# Patient Record
Sex: Male | Born: 1955
Health system: Southern US, Community
[De-identification: ages and names within clinical notes are randomized; demographics above are authoritative.]

## PROBLEM LIST (undated history)

## (undated) DIAGNOSIS — Z85528 Personal history of other malignant neoplasm of kidney: Secondary | ICD-10-CM

## (undated) DIAGNOSIS — M1990 Unspecified osteoarthritis, unspecified site: Secondary | ICD-10-CM

## (undated) DIAGNOSIS — I10 Essential (primary) hypertension: Secondary | ICD-10-CM

## (undated) HISTORY — PX: BACK SURGERY: SHX140

## (undated) HISTORY — PX: CHOLECYSTECTOMY: SHX55

## (undated) HISTORY — PX: NEPHRECTOMY: SHX65

---

## 2020-02-08 ENCOUNTER — Other Ambulatory Visit (HOSPITAL_BASED_OUTPATIENT_CLINIC_OR_DEPARTMENT_OTHER): Payer: Self-pay | Admitting: Physician Assistant

## 2020-02-08 ENCOUNTER — Emergency Department (HOSPITAL_BASED_OUTPATIENT_CLINIC_OR_DEPARTMENT_OTHER)
Admission: EM | Admit: 2020-02-08 | Discharge: 2020-02-08 | Disposition: A | Discharge status: Home / Self Care | Payer: Medicaid Other | Attending: Emergency Medicine | Admitting: Emergency Medicine

## 2020-02-08 ENCOUNTER — Encounter (HOSPITAL_BASED_OUTPATIENT_CLINIC_OR_DEPARTMENT_OTHER): Payer: Self-pay

## 2020-02-08 ENCOUNTER — Other Ambulatory Visit: Payer: Self-pay

## 2020-02-08 ENCOUNTER — Emergency Department (HOSPITAL_BASED_OUTPATIENT_CLINIC_OR_DEPARTMENT_OTHER): Payer: Medicaid Other

## 2020-02-08 DIAGNOSIS — I714 Abdominal aortic aneurysm, without rupture, unspecified: Secondary | ICD-10-CM

## 2020-02-08 DIAGNOSIS — Z85528 Personal history of other malignant neoplasm of kidney: Secondary | ICD-10-CM | POA: Insufficient documentation

## 2020-02-08 DIAGNOSIS — F17210 Nicotine dependence, cigarettes, uncomplicated: Secondary | ICD-10-CM | POA: Insufficient documentation

## 2020-02-08 DIAGNOSIS — K5732 Diverticulitis of large intestine without perforation or abscess without bleeding: Secondary | ICD-10-CM | POA: Diagnosis not present

## 2020-02-08 DIAGNOSIS — K5790 Diverticulosis of intestine, part unspecified, without perforation or abscess without bleeding: Secondary | ICD-10-CM

## 2020-02-08 DIAGNOSIS — R112 Nausea with vomiting, unspecified: Secondary | ICD-10-CM | POA: Diagnosis present

## 2020-02-08 DIAGNOSIS — Z79899 Other long term (current) drug therapy: Secondary | ICD-10-CM | POA: Diagnosis not present

## 2020-02-08 DIAGNOSIS — I700 Atherosclerosis of aorta: Secondary | ICD-10-CM

## 2020-02-08 DIAGNOSIS — Z7984 Long term (current) use of oral hypoglycemic drugs: Secondary | ICD-10-CM | POA: Insufficient documentation

## 2020-02-08 DIAGNOSIS — Z20822 Contact with and (suspected) exposure to covid-19: Secondary | ICD-10-CM | POA: Insufficient documentation

## 2020-02-08 DIAGNOSIS — I10 Essential (primary) hypertension: Secondary | ICD-10-CM | POA: Insufficient documentation

## 2020-02-08 DIAGNOSIS — R197 Diarrhea, unspecified: Secondary | ICD-10-CM

## 2020-02-08 HISTORY — DX: Essential (primary) hypertension: I10

## 2020-02-08 HISTORY — DX: Unspecified osteoarthritis, unspecified site: M19.90

## 2020-02-08 HISTORY — DX: Personal history of other malignant neoplasm of kidney: Z85.528

## 2020-02-08 LAB — COMPREHENSIVE METABOLIC PANEL
ALT: 14 U/L (ref 0–44)
AST: 18 U/L (ref 15–41)
Albumin: 4 g/dL (ref 3.5–5.0)
Alkaline Phosphatase: 59 U/L (ref 38–126)
Anion gap: 7 (ref 5–15)
BUN: 14 mg/dL (ref 8–23)
CO2: 25 mmol/L (ref 22–32)
Calcium: 8.8 mg/dL — ABNORMAL LOW (ref 8.9–10.3)
Chloride: 105 mmol/L (ref 98–111)
Creatinine, Ser: 1.15 mg/dL (ref 0.61–1.24)
GFR, Estimated: >60 mL/min (ref >60)
Glucose, Bld: 107 mg/dL — ABNORMAL HIGH (ref 70–99)
Potassium: 3.8 mmol/L (ref 3.5–5.1)
Sodium: 137 mmol/L (ref 135–145)
Total Bilirubin: 0.3 mg/dL (ref 0.3–1.2)
Total Protein: 7.3 g/dL (ref 6.5–8.1)

## 2020-02-08 LAB — CBC WITH DIFFERENTIAL/PLATELET
Abs Immature Granulocytes: 0.02 10*3/uL (ref 0.00–0.07)
Basophils Absolute: 0 10*3/uL (ref 0.0–0.1)
Basophils Relative: 1 %
Eosinophils Absolute: 0.2 10*3/uL (ref 0.0–0.5)
Eosinophils Relative: 5 %
HCT: 41.7 % (ref 39.0–52.0)
Hemoglobin: 13.5 g/dL (ref 13.0–17.0)
Immature Granulocytes: 0 %
Lymphocytes Relative: 34 %
Lymphs Abs: 1.7 10*3/uL (ref 0.7–4.0)
MCH: 27.3 pg (ref 26.0–34.0)
MCHC: 32.4 g/dL (ref 30.0–36.0)
MCV: 84.4 fL (ref 80.0–100.0)
Monocytes Absolute: 0.5 10*3/uL (ref 0.1–1.0)
Monocytes Relative: 9 %
Neutro Abs: 2.6 10*3/uL (ref 1.7–7.7)
Neutrophils Relative %: 51 %
Platelets: 210 10*3/uL (ref 150–400)
RBC: 4.94 MIL/uL (ref 4.22–5.81)
RDW: 13.7 % (ref 11.5–15.5)
WBC: 5 10*3/uL (ref 4.0–10.5)
nRBC: 0 % (ref 0.0–0.2)

## 2020-02-08 LAB — URINALYSIS, ROUTINE W REFLEX MICROSCOPIC
Bilirubin Urine: NEGATIVE
Glucose, UA: NEGATIVE mg/dL
Hgb urine dipstick: NEGATIVE
Ketones, ur: NEGATIVE mg/dL
Leukocytes,Ua: NEGATIVE
Nitrite: NEGATIVE
Protein, ur: NEGATIVE mg/dL
Specific Gravity, Urine: 1.02 (ref 1.005–1.030)
pH: 6.5 (ref 5.0–8.0)

## 2020-02-08 LAB — RESP PANEL BY RT PCR (RSV, FLU A&B, COVID)
Influenza A by PCR: NEGATIVE
Influenza B by PCR: NEGATIVE
Respiratory Syncytial Virus by PCR: NEGATIVE
SARS Coronavirus 2 by RT PCR: NEGATIVE

## 2020-02-08 LAB — LIPASE, BLOOD: Lipase: 59 U/L — ABNORMAL HIGH (ref 11–51)

## 2020-02-08 MED ORDER — ONDANSETRON HCL 4 MG/2ML IJ SOLN
4.0000 mg | Freq: Once | INTRAMUSCULAR | Status: AC
  Administered 2020-02-08: 4 mg via INTRAVENOUS
  Filled 2020-02-08: qty 2

## 2020-02-08 MED ORDER — HYDROMORPHONE HCL 1 MG/ML IJ SOLN
1.0000 mg | Freq: Once | INTRAMUSCULAR | Status: AC
  Administered 2020-02-08: 1 mg via INTRAVENOUS
  Filled 2020-02-08: qty 1

## 2020-02-08 MED ORDER — MORPHINE SULFATE (PF) 4 MG/ML IV SOLN
4.0000 mg | Freq: Once | INTRAVENOUS | Status: AC
  Administered 2020-02-08: 4 mg via INTRAVENOUS
  Filled 2020-02-08: qty 1

## 2020-02-08 MED ORDER — ONDANSETRON HCL 4 MG PO TABS: 4.0000 mg | ORAL_TABLET | Freq: Four times a day (QID) | ORAL | 0 refills | Status: DC

## 2020-02-08 MED ORDER — SODIUM CHLORIDE 0.9 % IV BOLUS
1000.0000 mL | Freq: Once | INTRAVENOUS | Status: AC
  Administered 2020-02-08: 1000 mL via INTRAVENOUS

## 2020-02-08 NOTE — ED Provider Notes (Signed)
[facility] EMERGENCY DEPARTMENT Provider Note   CSN: [id number] Arrival date & time: 02/08/20  1744     History Chief Complaint  Patient presents with  . Abdominal Pain    Chase Floyd is a 64 y.o. male.  HPI   Pt is a 64 y/o male with a h/o arthritis, renal carcinoma s/p R nephrectomy, cholecystectomy, who presents to the ED today for eval of NVD for the last 3 days. Reports 3-4 episodes of vomiting daily and about 3-4 episodes of diarrhea.  Reports diffuse abd pain that was intermittent initially but is now constant. Rates pain 10/10. Denies fevers. Reports urinary frequency, but denies dysuria or urgency.  Past Medical History:  Diagnosis Date  . Arthritis   . History of kidney cancer   . Hypertension     There are no problems to display for this patient.   Past Surgical History:  Procedure Laterality Date  . BACK SURGERY    . CHOLECYSTECTOMY    . NEPHRECTOMY         No family history on file.  Social History   Tobacco Use  . Smoking status: Current Every Day Smoker    Types: Cigarettes  . Smokeless tobacco: Never Used  Vaping Use  . Vaping Use: Never used  Substance Use Topics  . Alcohol use: Yes    Comment: occ  . Drug use: Yes    Types: Marijuana    Home Medications Prior to Admission medications   Medication Sig Start Date End Date Taking? Authorizing Provider  amLODipine (NORVASC) 10 MG tablet Take 10 mg by mouth daily. 12/29/19   [provider]  cyclobenzaprine (FLEXERIL) 10 MG tablet Take 10 mg by mouth 2 (two) times daily as needed. 12/29/19   [provider]  metFORMIN (GLUCOPHAGE) 500 MG tablet Take 500 mg by mouth 2 (two) times daily. 10/05/19   [provider]  ondansetron (ZOFRAN) 4 MG tablet Take 1 tablet (4 mg total) by mouth every 6 (six) hours. 02/08/20   Takeria Marquina S, PA-C  pantoprazole (PROTONIX) 40 MG tablet Take 40 mg by mouth 2 (two) times daily. 12/29/19   [provider]  traMADol  (ULTRAM) 50 MG tablet Take 50 mg by mouth 3 (three) times daily as needed. 12/29/19   [provider]    Allergies    Patient has no known allergies.  Review of Systems   Review of Systems  Constitutional: Negative for fever.  HENT: Negative for ear pain and sore throat.   Eyes: Negative for visual disturbance.  Respiratory: Negative for cough and shortness of breath.   Cardiovascular: Negative for chest pain.  Gastrointestinal: Positive for abdominal pain, diarrhea, nausea and vomiting.  Genitourinary: Negative for dysuria and hematuria.  Musculoskeletal: Negative for back pain.  Skin: Negative for rash.  Neurological: Negative for seizures and syncope.  All other systems reviewed and are negative.   Physical Exam Updated Vital Signs BP 135/86 (BP Location: Right Arm)   Pulse 75   Temp 98.5 F (36.9 C) (Oral)   Resp 19   Ht 6\' 2"  (1.88 m)   Wt 106.6 kg   SpO2 99%   BMI 30.17 kg/m   Physical Exam Vitals and nursing note reviewed.  Constitutional:      Appearance: He is well-developed.  HENT:     Head: Normocephalic and atraumatic.  Eyes:     Conjunctiva/sclera: Conjunctivae normal.  Cardiovascular:     Rate and Rhythm: Normal rate and regular  rhythm.     Heart sounds: Normal heart sounds. No murmur heard.   Pulmonary:     Effort: Pulmonary effort is normal. No respiratory distress.     Breath sounds: Normal breath sounds. No wheezing, rhonchi or rales.  Abdominal:     General: Bowel sounds are normal.     Palpations: Abdomen is soft.     Tenderness: There is abdominal tenderness in the right lower quadrant. There is guarding. There is no rebound.  Musculoskeletal:     Cervical back: Neck supple.  Skin:    General: Skin is warm and dry.  Neurological:     Mental Status: He is alert.     ED Results / Procedures / Treatments   Labs (all labs ordered are listed, but only abnormal results are displayed) Labs Reviewed  COMPREHENSIVE METABOLIC PANEL -  Abnormal; Notable for the following components:      Result Value   Glucose, Bld 107 (*)    Calcium 8.8 (*)    All other components within normal limits  LIPASE, BLOOD - Abnormal; Notable for the following components:   Lipase 59 (*)    All other components within normal limits  RESP PANEL BY RT PCR (RSV, FLU A&B, COVID)  CBC WITH DIFFERENTIAL/PLATELET  URINALYSIS, ROUTINE W REFLEX MICROSCOPIC    EKG None  Radiology CT ABDOMEN PELVIS WO CONTRAST  Result Date: 02/08/2020 CLINICAL DATA:  64 year old male with right lower quadrant abdominal pain. EXAM: CT ABDOMEN AND PELVIS WITHOUT CONTRAST TECHNIQUE: Multidetector CT imaging of the abdomen and pelvis was performed following the standard protocol without IV contrast. COMPARISON:  CT abdomen pelvis dated 07/15/2017. FINDINGS: Evaluation of this exam is limited in the absence of intravenous contrast. Lower chest: Small scattered pneumatoceles. The visualized lung bases are otherwise clear. Coronary vascular calcifications noted. No intra-abdominal free air or free fluid. Hepatobiliary: The liver is unremarkable. No intrahepatic biliary ductal dilatation. Cholecystectomy. No retained calcified stone noted the central CBD. Pancreas: Unremarkable. No pancreatic ductal dilatation or surrounding inflammatory changes. Spleen: Normal in size without focal abnormality. Adrenals/Urinary Tract: The adrenal glands unremarkable. Status post prior right nephrectomy. No abnormal soft tissue noted in the nephrectomy bed. The left kidney is unremarkable. The left ureter and urinary bladder appear unremarkable. Stomach/Bowel: There is sigmoid diverticulosis without active inflammatory changes. There is no bowel obstruction. The appendix is normal. Vascular/Lymphatic: Moderate aortoiliac atherosclerotic disease. A 3 cm infrarenal abdominal aortic aneurysm similar to prior CT of 2019. Follow-up as per recommendation of prior CT. The IVC is unremarkable. No portal venous  gas. There is no adenopathy. Reproductive: The prostate and seminal vesicles are grossly unremarkable. No pelvic mass. Other: None Musculoskeletal: Degenerative changes of the spine. No acute osseous pathology. IMPRESSION: 1. No acute intra-abdominal or pelvic pathology. No bowel obstruction. Normal appendix. 2. Sigmoid diverticulosis. 3. Aortic Atherosclerosis (ICD10-I70.0). 4. A 3 cm infrarenal abdominal aortic aneurysm. Follow-up as per recommendation of prior CT. Electronically Signed   By: Elgie Collard M.D.   On: 02/08/2020 18:54    Procedures Procedures (including critical care time)  Medications Ordered in ED Medications  sodium chloride 0.9 % bolus 1,000 mL ( Intravenous Stopped 02/08/20 1956)  ondansetron (ZOFRAN) injection 4 mg (4 mg Intravenous Given 02/08/20 1851)  morphine 4 MG/ML injection 4 mg (4 mg Intravenous Given 02/08/20 1854)  HYDROmorphone (DILAUDID) injection 1 mg (1 mg Intravenous Given 02/08/20 2009)    ED Course  I have reviewed the triage vital signs and the nursing notes.  Pertinent labs & imaging results that were available during my care of the patient were reviewed by me and considered in my medical decision making (see chart for details).    MDM Rules/Calculators/A&P                          64 year old male presenting for evaluation of abdominal pain nausea vomiting diarrhea for the last 4 days.  Afebrile, vital signs reassuring.  He does have right lower quadrant tenderness on exam with guarding.  Reviewed/interpreted lab CBC is without leukocytosis or anemia CMP with normal electrolytes, kidney and liver function Lipase is marginally elevated  - could be early pancreatitis however this seems more related to viral illness with diarrhea UA negative for UTI  CT abdomen/pelvis - 1. No acute intra-abdominal or pelvic pathology. No bowel obstruction. Normal appendix. 2. Sigmoid diverticulosis. 3. Aortic Atherosclerosis (ICD10-I70.0). 4. A 3 cm  infrarenal abdominal aortic aneurysm. Follow-up as per recommendation of prior CT.   Patient was given IV fluids, pain medications and Zofran.  On reassessment patient's pain is feeling improved.  We discussed the CT findings including chronic changes and no acute findings.  Discussed likely diagnosis of viral illness though could be early pancreatitis given marginal elevation of his lipase.  Will give Zofran for home.  Have advised close follow-up with PCP and strict return precautions.  He voices understanding of the plan and reasons to return.  All questions answered.  Patient stable for discharge.    Final Clinical Impression(s) / ED Diagnoses Final diagnoses:  Nausea vomiting and diarrhea  Diverticulosis  Abdominal aortic aneurysm (AAA) without rupture (HCC)  Aortic atherosclerosis (HCC)    Rx / DC Orders ED Discharge Orders         Ordered    ondansetron (ZOFRAN) 4 MG tablet  Every 6 hours,   Status:  Discontinued        02/08/20 2002    ondansetron (ZOFRAN) 4 MG tablet  Every 6 hours        02/08/20 2003           Rayne Du 02/08/20 2105    Gwyneth Sprout, MD 02/11/20 [phone]

## 2020-02-08 NOTE — ED Triage Notes (Signed)
Pt c/o abd pain, n/v/d day 3-NAD-steady slow gait

## 2020-02-08 NOTE — ED Notes (Signed)
Pt given coke 

## 2020-02-08 NOTE — Discharge Instructions (Signed)
You were given a prescription for zofran to help with your nausea. Please take as directed.  Stay well hydrated.   Follow up with the vascular doctor in regards to your abdominal aortic aneurysm.  Please follow up with your primary doctor within the next 5-7 days.  If you do not have a primary care provider, information for a healthcare clinic has been provided for you to make arrangements for follow up care. Please return to the ER sooner if you have any new or worsening symptoms, or if you have any of the following symptoms:  Abdominal pain that does not go away.  You have a fever.  You keep throwing up (vomiting).  The pain is felt only in portions of the abdomen. Pain in the right side could possibly be appendicitis. In an adult, pain in the left lower portion of the abdomen could be colitis or diverticulitis.  You pass bloody or black tarry stools.  There is bright red blood in the stool.  The constipation stays for more than 4 days.  There is belly (abdominal) or rectal pain.  You do not seem to be getting better.  You have any questions or concerns.

## 2020-02-09 MED FILL — ONDANSETRON HCL 4 MG TABLET: 4 | 3 days supply | Qty: 12 | Fill #0

## 2021-03-30 IMAGING — CT CT ABD-PELV W/O CM
2 of 4 series · 16 of 46 positions shown, 18 images · non-contrast
Comparison: CT abdomen pelvis dated 07/15/2017.

CLINICAL DATA: 64-year-old male with right lower quadrant abdominal
pain.

EXAM:
CT ABDOMEN AND PELVIS WITHOUT CONTRAST
TECHNIQUE: Multidetector CT imaging of the abdomen and pelvis was performed
following the standard protocol without IV contrast.

[Series 2: axial st · axial · 0.79mm/px · z∈[-539,-94]mm · 13 of 99 slices shown, 15 images]
[im 5/99  soft-tissue]
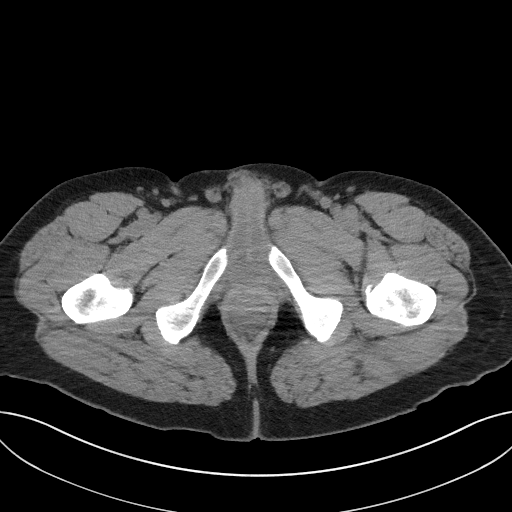
[im 5/99  bone]
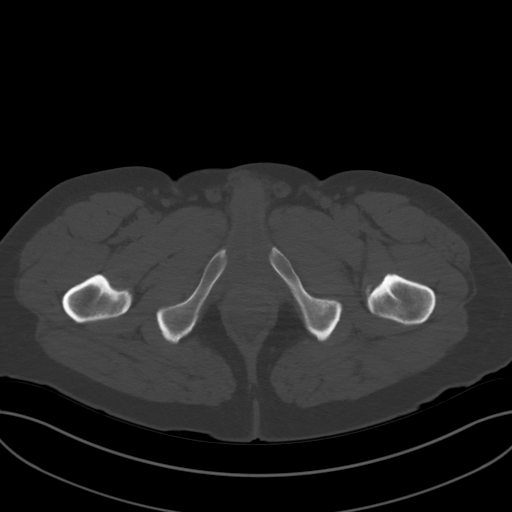
[im 13/99  soft-tissue]
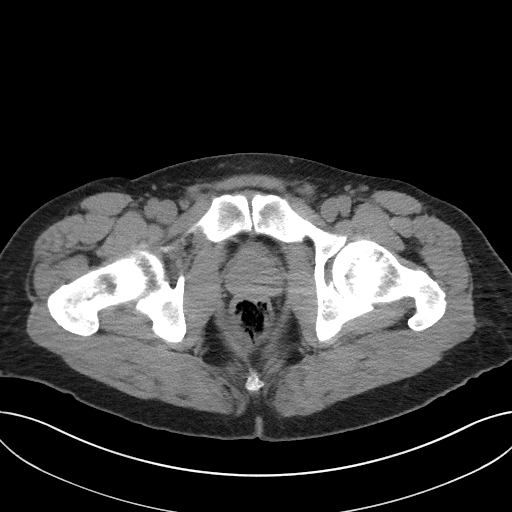
[im 22/99  soft-tissue]
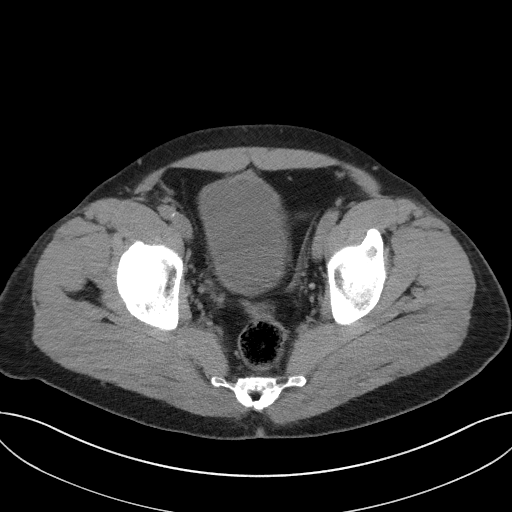
[im 26/99  soft-tissue]
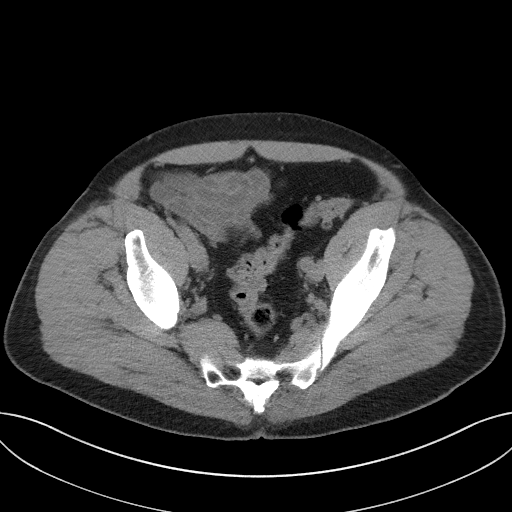
[im 35/99  soft-tissue]
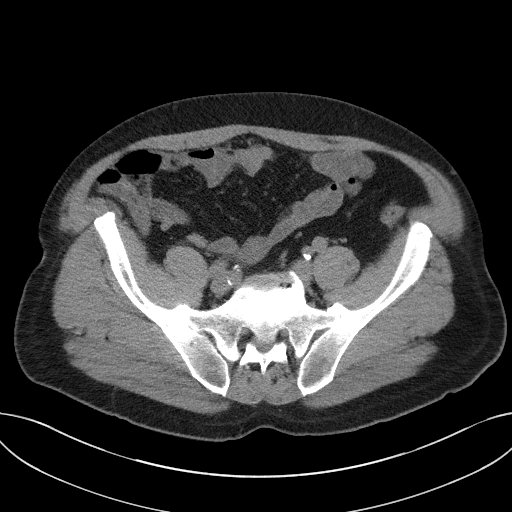
[im 43/99  soft-tissue]
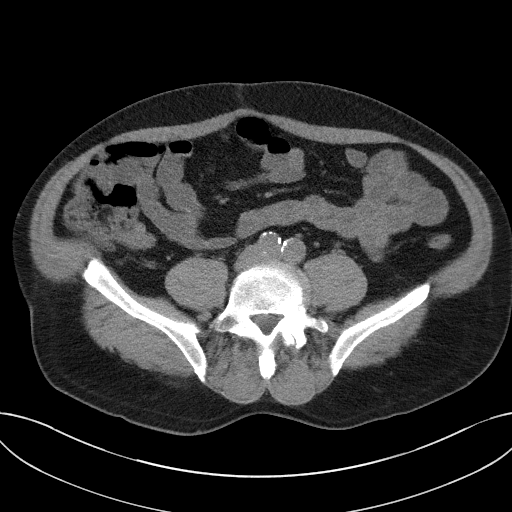
[im 52/99  soft-tissue]
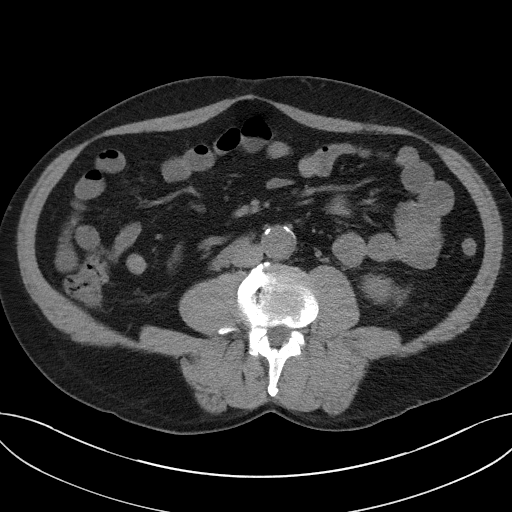
[im 56/99  soft-tissue]
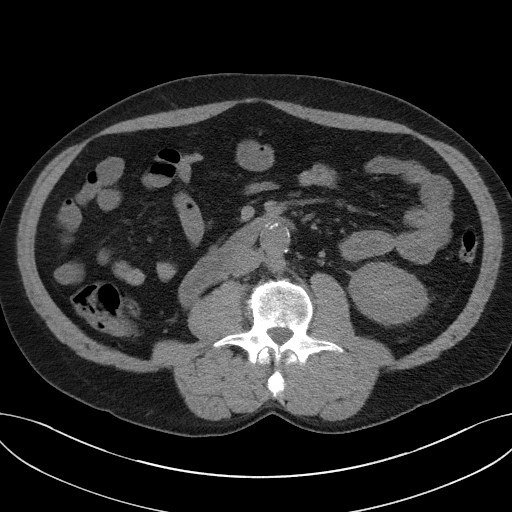
[im 64/99  soft-tissue]
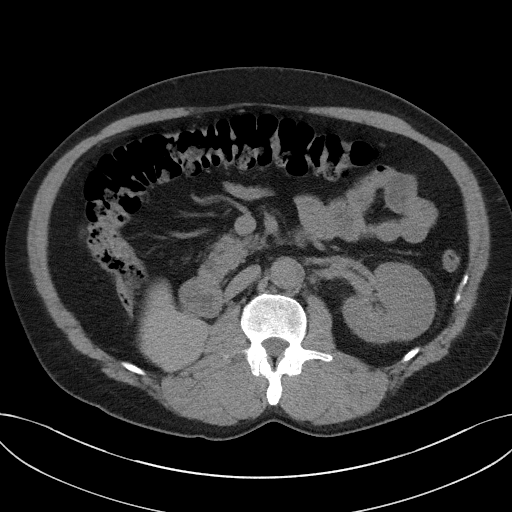
[im 64/99  bone]
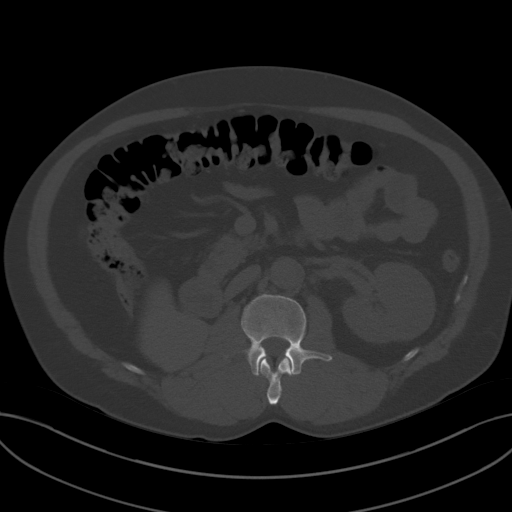
[im 73/99  soft-tissue]
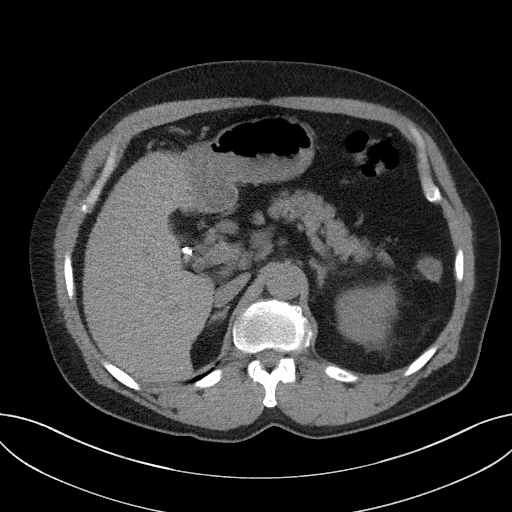
[im 77/99  soft-tissue]
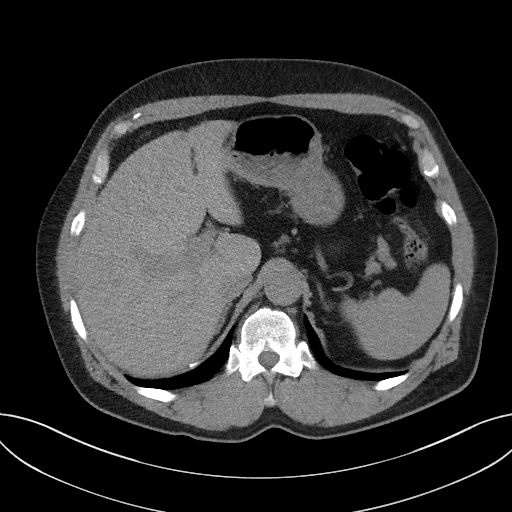
[im 86/99  soft-tissue]
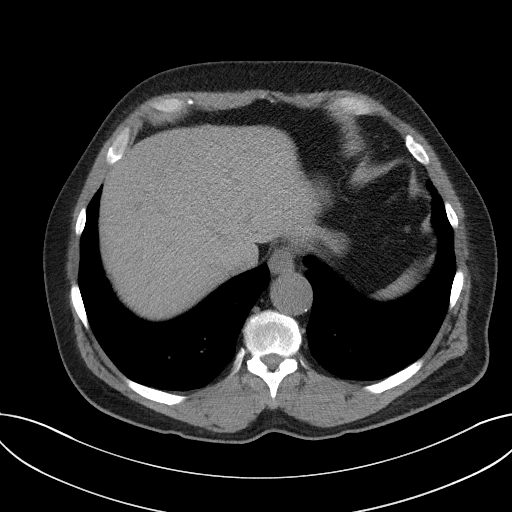
[im 94/99  soft-tissue]
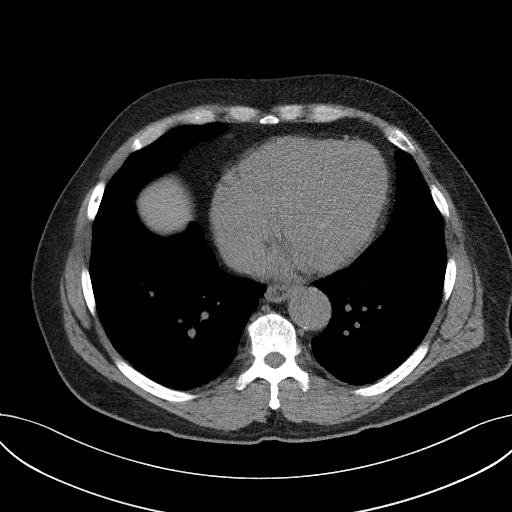

[Series 5: coronal st · coronal · 0.92mm/px · 3 of 106 slices shown]
[im 36/106  soft-tissue]
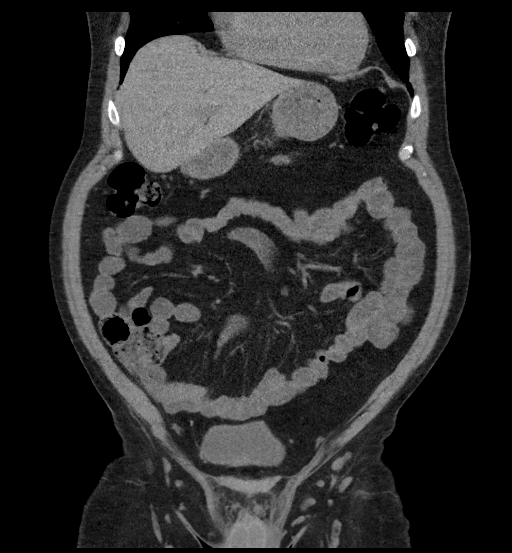
[im 47/106  soft-tissue]
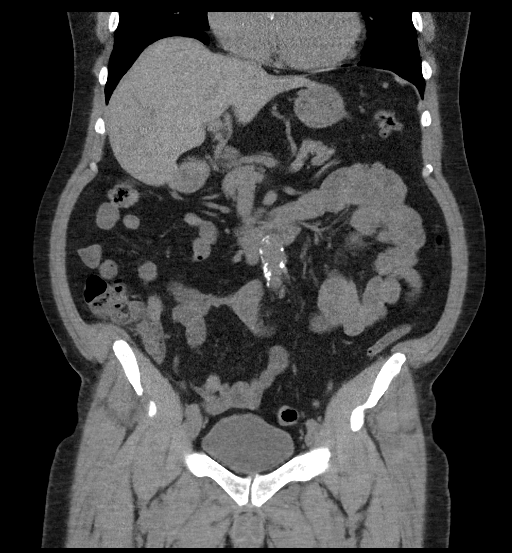
[im 59/106  soft-tissue]
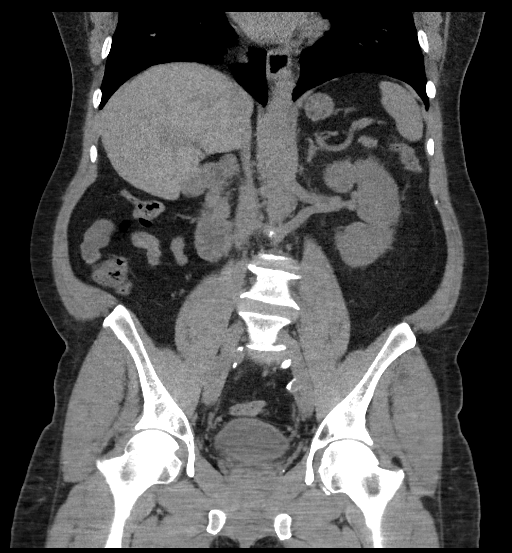

[16 of 46 positions shown; findings below may reference images not displayed]

FINDINGS: Evaluation of this exam is limited in the absence of intravenous
contrast.

Lower chest: Small scattered pneumatoceles. The visualized lung
bases are otherwise clear. Coronary vascular calcifications noted.

No intra-abdominal free air or free fluid.

Hepatobiliary: The liver is unremarkable. No intrahepatic biliary
ductal dilatation. Cholecystectomy. No retained calcified stone
noted the central CBD.

Pancreas: Unremarkable. No pancreatic ductal dilatation or
surrounding inflammatory changes.

Spleen: Normal in size without focal abnormality.

Adrenals/Urinary Tract: The adrenal glands unremarkable. Status post
prior right nephrectomy. No abnormal soft tissue noted in the
nephrectomy bed. The left kidney is unremarkable. The left ureter
and urinary bladder appear unremarkable.

Stomach/Bowel: There is sigmoid diverticulosis without active
inflammatory changes. There is no bowel obstruction. The appendix is
normal.

Vascular/Lymphatic: Moderate aortoiliac atherosclerotic disease. A 3
cm infrarenal abdominal aortic aneurysm similar to prior CT of 1704.
Follow-up as per recommendation of prior CT. The IVC is
unremarkable. No portal venous gas. There is no adenopathy.

Reproductive: The prostate and seminal vesicles are grossly
unremarkable. No pelvic mass.

Other: None

Musculoskeletal: Degenerative changes of the spine. No acute osseous
pathology.
IMPRESSION: 1. No acute intra-abdominal or pelvic pathology. No bowel
obstruction. Normal appendix.
2. Sigmoid diverticulosis.
3. Aortic Atherosclerosis ([id number]).
4. A 3 cm infrarenal abdominal aortic aneurysm. Follow-up as per
recommendation of prior CT.
